# Patient Record
Sex: Female | Born: 1965 | Race: Black or African American | Hispanic: No | Smoking: Never smoker
Health system: Southern US, Community
[De-identification: ages and names within clinical notes are randomized; demographics above are authoritative.]

---

## 1998-06-10 ENCOUNTER — Emergency Department (HOSPITAL_COMMUNITY): Admission: EM | Admit: 1998-06-10 | Discharge: 1998-06-10 | Payer: Self-pay | Admitting: Emergency Medicine

## 2000-06-07 ENCOUNTER — Emergency Department (HOSPITAL_COMMUNITY): Admission: EM | Admit: 2000-06-07 | Discharge: 2000-06-07 | Payer: Self-pay | Admitting: Emergency Medicine

## 2000-09-06 ENCOUNTER — Encounter: Payer: Self-pay | Admitting: Surgery

## 2000-09-06 ENCOUNTER — Encounter: Payer: Self-pay | Admitting: Emergency Medicine

## 2000-09-06 ENCOUNTER — Inpatient Hospital Stay (HOSPITAL_COMMUNITY): Admission: EM | Admit: 2000-09-06 | Discharge: 2000-09-08 | Payer: Self-pay | Admitting: Emergency Medicine

## 2001-10-25 ENCOUNTER — Emergency Department (HOSPITAL_COMMUNITY): Admission: EM | Admit: 2001-10-25 | Discharge: 2001-10-25 | Payer: Self-pay | Admitting: Emergency Medicine

## 2002-12-04 ENCOUNTER — Encounter: Payer: Self-pay | Admitting: Emergency Medicine

## 2002-12-04 ENCOUNTER — Emergency Department (HOSPITAL_COMMUNITY): Admission: EM | Admit: 2002-12-04 | Discharge: 2002-12-04 | Payer: Self-pay | Admitting: Emergency Medicine

## 2004-11-17 ENCOUNTER — Other Ambulatory Visit: Admission: RE | Admit: 2004-11-17 | Discharge: 2004-11-17 | Payer: Self-pay | Admitting: *Deleted

## 2005-05-05 ENCOUNTER — Emergency Department (HOSPITAL_COMMUNITY): Admission: EM | Admit: 2005-05-05 | Discharge: 2005-05-05 | Payer: Self-pay | Admitting: Emergency Medicine

## 2006-05-11 ENCOUNTER — Encounter: Admission: RE | Admit: 2006-05-11 | Discharge: 2006-05-11 | Payer: Self-pay | Admitting: Otolaryngology

## 2009-08-10 ENCOUNTER — Emergency Department (HOSPITAL_COMMUNITY): Admission: EM | Admit: 2009-08-10 | Discharge: 2009-08-10 | Payer: Self-pay | Admitting: Emergency Medicine

## 2010-03-16 ENCOUNTER — Encounter: Payer: Self-pay | Admitting: Otolaryngology

## 2010-03-16 ENCOUNTER — Encounter: Payer: Self-pay | Admitting: Family Medicine

## 2010-05-11 LAB — POCT I-STAT, CHEM 8
BUN: 13 mg/dL (ref 6–23)
Calcium, Ion: 1.11 mmol/L — ABNORMAL LOW (ref 1.12–1.32)
Chloride: 105 mEq/L (ref 96–112)

## 2010-05-11 LAB — CBC
HCT: 38.7 % (ref 36.0–46.0)
MCV: 92.9 fL (ref 78.0–100.0)
RBC: 4.16 MIL/uL (ref 3.87–5.11)
WBC: 9 10*3/uL (ref 4.0–10.5)

## 2010-05-11 LAB — D-DIMER, QUANTITATIVE: D-Dimer, Quant: 0.28 ug/mL-FEU (ref 0.00–0.48)

## 2010-05-11 LAB — POCT CARDIAC MARKERS
Troponin i, poc: <0.05 ng/mL (ref 0.00–0.09)
Troponin i, poc: <0.05 ng/mL (ref 0.00–0.09)

## 2011-03-29 ENCOUNTER — Emergency Department (HOSPITAL_COMMUNITY)
Admission: EM | Admit: 2011-03-29 | Discharge: 2011-03-29 | Disposition: A | Discharge status: Home / Self Care | Payer: 59 | Attending: Emergency Medicine | Admitting: Emergency Medicine

## 2011-03-29 ENCOUNTER — Encounter (HOSPITAL_COMMUNITY): Payer: Self-pay | Admitting: *Deleted

## 2011-03-29 DIAGNOSIS — J3489 Other specified disorders of nose and nasal sinuses: Secondary | ICD-10-CM | POA: Insufficient documentation

## 2011-03-29 DIAGNOSIS — J019 Acute sinusitis, unspecified: Secondary | ICD-10-CM | POA: Insufficient documentation

## 2011-03-29 DIAGNOSIS — H9209 Otalgia, unspecified ear: Secondary | ICD-10-CM | POA: Insufficient documentation

## 2011-03-29 DIAGNOSIS — R599 Enlarged lymph nodes, unspecified: Secondary | ICD-10-CM | POA: Insufficient documentation

## 2011-03-29 DIAGNOSIS — R51 Headache: Secondary | ICD-10-CM | POA: Insufficient documentation

## 2011-03-29 MED ORDER — AZITHROMYCIN 250 MG PO TABS: 250.0000 mg | ORAL_TABLET | Freq: Every day | ORAL | Status: AC

## 2011-03-29 NOTE — ED Notes (Signed)
sts it hurts to swallow as well.

## 2011-03-29 NOTE — ED Notes (Signed)
Pt reports "hearing her pulse in her ear." now having pain to right side of head and swelling into her jaw, having difficulty swallowing. Airway is intact at triage, no distress noted.

## 2011-03-29 NOTE — ED Provider Notes (Signed)
History     CSN: [id number]  Arrival date & time 03/29/11  4098   First MD Initiated Contact with Patient 03/29/11 1054      Chief Complaint  Patient presents with  . Otalgia  . Headache    (Consider location/radiation/quality/duration/timing/severity/associated sxs/prior treatment) HPI Comments: Patient complaining of pain in her right ear and a headache.  Patient reports that she has had this ear pain in the past and has been previously evaluated by ENT for this complaint.  She reports that she was told that her "ear drum was more sensitive"  She reports that her headache is located on the left side of her face and that she feels that she may have a headache.  Symptoms have been associated with nasal congestion and rhinorrhea.  She also feels like the lymph nodes in her neck are enlarged and tender.  Patient is a 46 y.o. female presenting with ear pain. The history is provided by the patient.  Otalgia This is a new problem. The current episode started 6 to 12 hours ago. There is pain in the right ear. The problem has been gradually worsening. There has been no fever. Associated symptoms include headaches and rhinorrhea. Pertinent negatives include no ear discharge, no hearing loss, no sore throat, no vomiting, no neck pain and no cough.    History reviewed. No pertinent past medical history.  History reviewed. No pertinent past surgical history.  History reviewed. No pertinent family history.  History  Substance Use Topics  . Smoking status: Never Smoker   . Smokeless tobacco: Not on file  . Alcohol Use: No    OB History    Grav Para Term Preterm Abortions TAB SAB Ect Mult Living                  Review of Systems  Constitutional: Negative for fever and chills.  HENT: Positive for ear pain, rhinorrhea and sinus pressure. Negative for hearing loss, sore throat, facial swelling, neck pain, neck stiffness and ear discharge.   Eyes: Negative for photophobia and pain.    Respiratory: Negative for cough and shortness of breath.   Cardiovascular: Negative for chest pain.  Gastrointestinal: Negative for vomiting.  Neurological: Positive for headaches. Negative for dizziness and light-headedness.  Hematological: Positive for adenopathy.    Allergies  Review of patient's allergies indicates no known allergies.  Home Medications   Current Outpatient Rx  Name Route Sig Dispense Refill  . ACETAMINOPHEN 500 MG PO TABS Oral Take 1,000 mg by mouth every 6 (six) hours as needed. For pain    . AZITHROMYCIN 250 MG PO TABS Oral Take 1 tablet (250 mg total) by mouth daily. Take first 2 tablets together, then 1 every day until finished. 6 tablet 0    BP 153/89  Pulse 77  Temp(Src) 98.4 F (36.9 C) (Oral)  Resp 18  SpO2 99%  LMP 02/26/2011  Physical Exam  Nursing note and vitals reviewed. Constitutional: She is oriented to person, place, and time. She appears well-developed and well-nourished. No distress.  HENT:  Head: Normocephalic and atraumatic. No trismus in the jaw.  Right Ear: Hearing, tympanic membrane, external ear and ear canal normal.  Left Ear: Hearing, tympanic membrane, external ear and ear canal normal.  Nose: Mucosal edema present. Right sinus exhibits maxillary sinus tenderness. Right sinus exhibits no frontal sinus tenderness. Left sinus exhibits no maxillary sinus tenderness and no frontal sinus tenderness.  Mouth/Throat: Uvula is midline, oropharynx is clear and moist and  mucous membranes are normal. No oral lesions. Normal dentition. No dental abscesses.       No edema, erythema, or tenderness to palpation of gingiva.  No abscess visualized.  Eyes: EOM are normal.  Neck: Normal range of motion. Neck supple.  Cardiovascular: Normal rate, regular rhythm and normal heart sounds.   Pulmonary/Chest: Effort normal and breath sounds normal.  Lymphadenopathy:    She has cervical adenopathy.  Neurological: She is alert and oriented to person,  place, and time.  Skin: Skin is warm and dry. No rash noted. She is not diaphoretic. No erythema.  Psychiatric: She has a normal mood and affect.    ED Course  Procedures (including critical care time)  Labs Reviewed - No data to display No results found.   1. Acute sinusitis    Patient requesting pain medication prior to discharge.  Discussed with patient that I do not feel that narcotic medication is appropriate for treatment of sinusitis.  Patient instructed to take Ibuprofen for pain and a decongestant.  Patient given Rx for antibiotic therapy.   MDM  Patient is hemodynamically stable and in NAD prior to discharge. Diagnosis was discussed with patient who verbalizes understanding and is agreeable to discharge.  Signs and symptoms consistent with Acute Sinusitis.        Pascal Lux [place], PA-C 03/30/11 1857

## 2011-03-30 NOTE — ED Provider Notes (Signed)
Medical screening examination/treatment/procedure(s) were performed by non-physician practitioner and as supervising physician I was immediately available for consultation/collaboration.   Glynn Octave, MD 03/30/11 1901

## 2011-04-07 IMAGING — CR DG CHEST 1V PORT
1 series · 1 of 1 positions shown · non-contrast
Comparison: None.

CLINICAL DATA: Mid chest pain.

PORTABLE CHEST - 1 VIEW

[view not recorded]
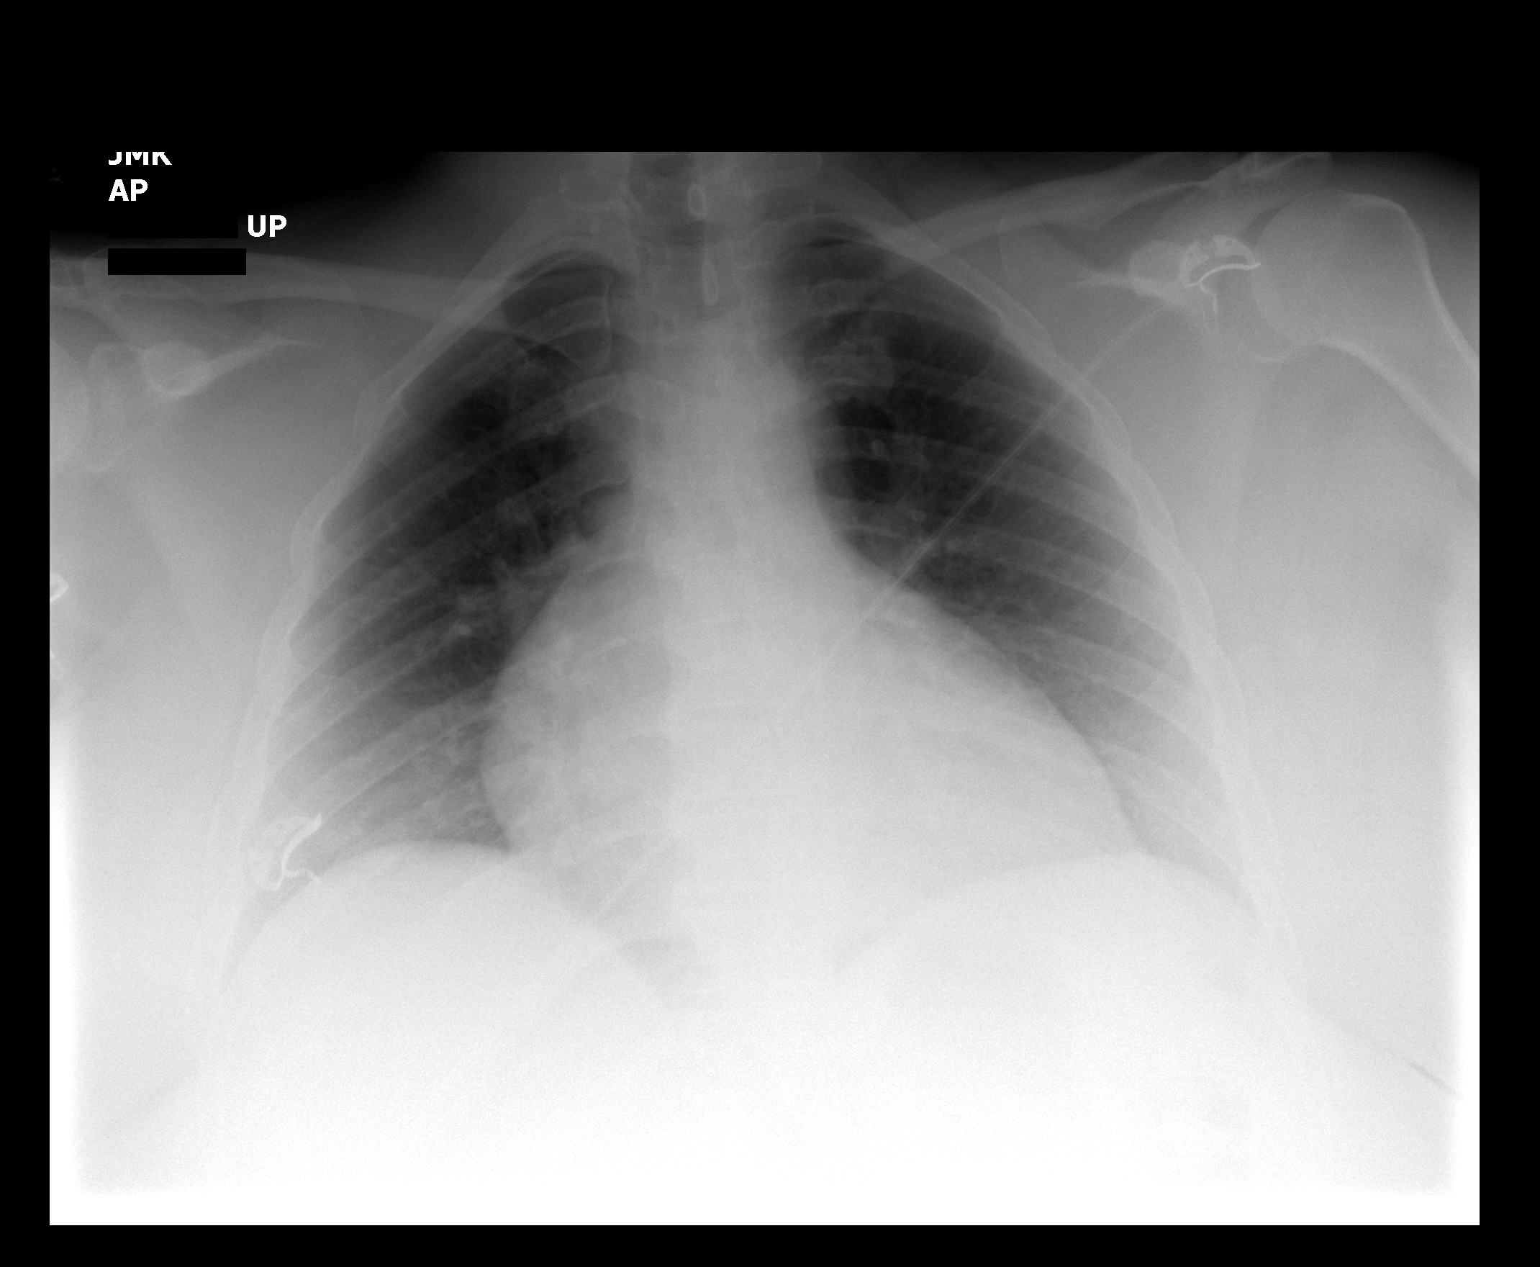

[1 of 1 positions shown; findings below may reference images not displayed]

FINDINGS: The lungs are well-aerated and clear.  There is no
evidence of focal opacification, pleural effusion or pneumothorax.

The cardiomediastinal silhouette is borderline prominent.  No acute
osseous abnormalities are seen.
IMPRESSION: Lungs clear; cardiomediastinal silhouette borderline prominent.

## 2014-08-26 ENCOUNTER — Emergency Department (INDEPENDENT_AMBULATORY_CARE_PROVIDER_SITE_OTHER)
Admission: EM | Admit: 2014-08-26 | Discharge: 2014-08-26 | Disposition: A | Discharge status: Home / Self Care | Payer: 59 | Source: Home / Self Care | Attending: Emergency Medicine | Admitting: Emergency Medicine

## 2014-08-26 ENCOUNTER — Encounter (HOSPITAL_COMMUNITY): Payer: Self-pay | Admitting: Emergency Medicine

## 2014-08-26 DIAGNOSIS — J020 Streptococcal pharyngitis: Secondary | ICD-10-CM

## 2014-08-26 LAB — POCT RAPID STREP A: STREPTOCOCCUS, GROUP A SCREEN (DIRECT): POSITIVE — AB

## 2014-08-26 MED ORDER — AMOXICILLIN 875 MG PO TABS: 875.0000 mg | ORAL_TABLET | Freq: Two times a day (BID) | ORAL | Status: AC

## 2014-08-26 NOTE — ED Provider Notes (Signed)
CSN: [id number]     Arrival date & time 08/26/14  1532 History   First MD Initiated Contact with Patient 08/26/14 1732     No chief complaint on file. CC: sore throat  (Consider location/radiation/quality/duration/timing/severity/associated sxs/prior Treatment) HPI She is a 49 year old woman here for evaluation of sore throat. Her symptoms started 2 days ago with sore throat, headache, subjective fevers. Pain is worse with swallowing. She is able to eat and drink, although with pain. No nasal congestion or rhinorrhea. No cough or shortness of breath. No nausea or vomiting. Her daughter had strep throat 3 weeks ago.  No past medical history on file. No past surgical history on file. No family history on file. History  Substance Use Topics  . Smoking status: Never Smoker   . Smokeless tobacco: Not on file  . Alcohol Use: No   OB History    No data available     Review of Systems As in history of present illness Allergies  Review of patient's allergies indicates no known allergies.  Home Medications   Prior to Admission medications   Medication Sig Start Date End Date Taking? Authorizing Provider  acetaminophen (TYLENOL) 500 MG tablet Take 1,000 mg by mouth every 6 (six) hours as needed. For pain    Historical Provider, MD  amoxicillin (AMOXIL) 875 MG tablet Take 1 tablet (875 mg total) by mouth 2 (two) times daily. 08/26/14   Charm RingsErin J Evely Gainey, MD   BP 150/92 mmHg  Pulse 81  Temp(Src) 100.4 F (38 C) (Oral)  Resp 20  SpO2 100% Physical Exam  Constitutional: She is oriented to person, place, and time. She appears well-developed and well-nourished. No distress.  HENT:  Nose: Nose normal.  Mouth/Throat: Oropharyngeal exudate present.  Tonsils are erythematous and swollen  Eyes: Conjunctivae are normal.  Neck: Neck supple.  Cardiovascular: Normal rate, regular rhythm and normal heart sounds.   No murmur heard. Pulmonary/Chest: Effort normal and breath sounds normal. No  respiratory distress. She has no wheezes. She has no rales.  Lymphadenopathy:    She has cervical adenopathy.  Neurological: She is alert and oriented to person, place, and time.    ED Course  Procedures (including critical care time) Labs Review Labs Reviewed - No data to display  Imaging Review No results found.   MDM   1. Strep pharyngitis    Rapid strep is positive. Treat with amoxicillin for 10 days. Symptomatic care discussed. Follow-up as needed.    Charm RingsErin J Vinnie Gombert, MD 08/26/14 [phone]

## 2014-08-26 NOTE — Discharge Instructions (Signed)
You have strep throat. Take amoxicillin 1 pill twice a day for 10 days. You can use salt water gargles, Chloraseptic spray, Cepacol lozenges while the antibiotics kick in. Follow-up as needed.

## 2014-08-26 NOTE — ED Notes (Signed)
Pt states that she has had a sore throat since 08/24/2014 and that it hurts for her to swallow
